# Patient Record
Sex: Female | Born: 1945 | Race: White | Hispanic: No | Smoking: Never smoker
Health system: Southern US, Community
[De-identification: ages and names within clinical notes are randomized; demographics above are authoritative.]

## PROBLEM LIST (undated history)

## (undated) DIAGNOSIS — F32A Depression, unspecified: Secondary | ICD-10-CM

## (undated) DIAGNOSIS — I10 Essential (primary) hypertension: Secondary | ICD-10-CM

## (undated) HISTORY — PX: CHOLECYSTECTOMY: SHX55

## (undated) HISTORY — PX: TUBAL LIGATION: SHX77

## (undated) HISTORY — PX: ABDOMINAL HYSTERECTOMY: SHX81

---

## 2020-04-14 ENCOUNTER — Other Ambulatory Visit: Payer: Self-pay | Admitting: Nephrology

## 2020-04-14 DIAGNOSIS — N1832 Chronic kidney disease, stage 3b: Secondary | ICD-10-CM

## 2020-04-14 DIAGNOSIS — I129 Hypertensive chronic kidney disease with stage 1 through stage 4 chronic kidney disease, or unspecified chronic kidney disease: Secondary | ICD-10-CM

## 2020-04-22 ENCOUNTER — Other Ambulatory Visit: Payer: Self-pay

## 2020-04-22 ENCOUNTER — Ambulatory Visit
Admission: RE | Admit: 2020-04-22 | Discharge: 2020-04-22 | Disposition: A | Discharge status: Home / Self Care | Payer: Medicare Other | Source: Ambulatory Visit | Attending: Nephrology | Admitting: Nephrology

## 2020-04-22 DIAGNOSIS — I129 Hypertensive chronic kidney disease with stage 1 through stage 4 chronic kidney disease, or unspecified chronic kidney disease: Secondary | ICD-10-CM | POA: Diagnosis present

## 2020-04-22 DIAGNOSIS — E1121 Type 2 diabetes mellitus with diabetic nephropathy: Secondary | ICD-10-CM | POA: Insufficient documentation

## 2020-04-22 DIAGNOSIS — N1832 Chronic kidney disease, stage 3b: Secondary | ICD-10-CM | POA: Insufficient documentation

## 2020-08-31 ENCOUNTER — Other Ambulatory Visit: Payer: Self-pay

## 2020-08-31 ENCOUNTER — Ambulatory Visit
Admission: EM | Admit: 2020-08-31 | Discharge: 2020-08-31 | Disposition: A | Discharge status: Home / Self Care | Payer: Medicare Other | Attending: Internal Medicine | Admitting: Internal Medicine

## 2020-08-31 DIAGNOSIS — N200 Calculus of kidney: Secondary | ICD-10-CM | POA: Diagnosis present

## 2020-08-31 HISTORY — DX: Essential (primary) hypertension: I10

## 2020-08-31 HISTORY — DX: Depression, unspecified: F32.A

## 2020-08-31 LAB — URINALYSIS, COMPLETE (UACMP) WITH MICROSCOPIC
Glucose, UA: 100 mg/dL — AB
Leukocytes,Ua: NEGATIVE
Nitrite: NEGATIVE
Protein, ur: 100 mg/dL — AB
RBC / HPF: >50 RBC/hpf (ref 0–5)
Specific Gravity, Urine: >1.03 — ABNORMAL HIGH (ref 1.005–1.030)
pH: 5 (ref 5.0–8.0)

## 2020-08-31 MED ORDER — HYDROCODONE-ACETAMINOPHEN 5-325 MG PO TABS: 1.0000 | ORAL_TABLET | Freq: Four times a day (QID) | ORAL | 0 refills | Status: DC | PRN

## 2020-08-31 MED ORDER — ONDANSETRON 4 MG PO TBDP: 4.0000 mg | ORAL_TABLET | Freq: Three times a day (TID) | ORAL | 0 refills | Status: DC | PRN

## 2020-08-31 MED ORDER — TAMSULOSIN HCL 0.4 MG PO CAPS: 0.4000 mg | ORAL_CAPSULE | Freq: Every day | ORAL | 0 refills | Status: DC

## 2020-08-31 MED ORDER — TAMSULOSIN HCL 0.4 MG PO CAPS: 0.4000 mg | ORAL_CAPSULE | Freq: Every day | ORAL | 0 refills | Status: AC

## 2020-08-31 NOTE — ED Provider Notes (Signed)
[facility]    CSN: [id number] Arrival date & time: 08/31/20  1408      History   Chief Complaint Chief Complaint  Patient presents with  . Hematuria  . Back Pain    HPI Patty Harris is a 74 y.o. female comes to urgent care with complaints of left flank pain which radiates into the left groin area which started last night.  Patient said that the pain is intermittent in nature.  Onset is almost sudden and severe.  No known aggravating or relieving factors.  Currently pain is moderate more of a dull sensation than anything else.  It is associated with blood in urine.  She denies any dysuria urgency or frequency.  She denies any fever or chills.  She has nausea without vomiting.  She has a remote history of kidney stones.Marland Kitchen   HPI  Past Medical History:  Diagnosis Date  . Depression   . Hypertension     There are no problems to display for this patient.   Past Surgical History:  Procedure Laterality Date  . ABDOMINAL HYSTERECTOMY    . CESAREAN SECTION    . CHOLECYSTECTOMY    . TUBAL LIGATION      OB History   No obstetric history on file.      Home Medications    Prior to Admission medications   Medication Sig Start Date End Date Taking? Authorizing Provider  Cholecalciferol 10 MCG (400 UNIT) CAPS Take 1 capsule by mouth daily.   Yes [provider]  cloNIDine (CATAPRES) 0.2 MG tablet Take 0.2 mg by mouth at bedtime. 04/09/20  Yes [provider]  levocetirizine (XYZAL) 5 MG tablet Take 5 mg by mouth every evening.   Yes [provider]  losartan (COZAAR) 100 MG tablet Take 100 mg by mouth daily. 04/09/20  Yes [provider]  rosuvastatin (CRESTOR) 20 MG tablet Take 20 mg by mouth at bedtime. 07/15/20  Yes [provider]  sertraline (ZOLOFT) 100 MG tablet Take 100 mg by mouth daily. 08/25/20  Yes [provider]  zolpidem (AMBIEN) 5 MG tablet Take 5 mg by mouth at bedtime as needed. 07/13/20  Yes [provider]  HYDROcodone-acetaminophen (NORCO/VICODIN) 5-325 MG tablet Take 1 tablet by mouth every 6 (six) hours as needed for moderate pain or severe pain. 08/31/20   Rebeca Valdivia, Myrene Galas, MD  ondansetron (ZOFRAN ODT) 4 MG disintegrating tablet Take 1 tablet (4 mg total) by mouth every 8 (eight) hours as needed for nausea or vomiting. 08/31/20   Keyairra Kolinski, Myrene Galas, MD  tamsulosin (FLOMAX) 0.4 MG CAPS capsule Take 1 capsule (0.4 mg total) by mouth daily for 14 days. 08/31/20 09/14/20  Chase Picket, MD    Family History History reviewed. No pertinent family history.  Social History Social History   Tobacco Use  . Smoking status: Never Smoker  . Smokeless tobacco: Never Used  Vaping Use  . Vaping Use: Never used  Substance Use Topics  . Alcohol use: Never  . Drug use: Never     Allergies   Patient has no known allergies.   Review of Systems Review of Systems  Constitutional: Negative.   Respiratory: Negative.   Genitourinary: Positive for dysuria, flank pain and hematuria. Negative for frequency, vaginal discharge and vaginal pain.     Physical Exam Triage Vital Signs ED Triage Vitals  Enc Vitals Group     BP 08/31/20 1436 (!) 153/91     Pulse Rate 08/31/20 1436  82     Resp 08/31/20 1436 18     Temp 08/31/20 1436 99.3 F (37.4 C)     Temp Source 08/31/20 1436 Oral     SpO2 08/31/20 1436 97 %     Weight 08/31/20 1432 180 lb (81.6 kg)     Height 08/31/20 1432 5\' 2"  (1.575 m)     Head Circumference --      Peak Flow --      Pain Score 08/31/20 1431 6     Pain Loc --      Pain Edu? --      Excl. in [place]? --    No data found.  Updated Vital Signs BP (!) 153/91 (BP Location: Right Arm)   Pulse 82   Temp 99.3 F (37.4 C) (Oral)   Resp 18   Ht 5\' 2"  (1.575 m)   Wt 81.6 kg   SpO2 97%   BMI 32.92 kg/m   Visual Acuity Right Eye Distance:   Left Eye Distance:   Bilateral Distance:    Right Eye Near:   Left Eye Near:    Bilateral Near:     Physical  Exam Vitals and nursing note reviewed.  Constitutional:      General: She is not in acute distress.    Appearance: She is not ill-appearing.  Cardiovascular:     Rate and Rhythm: Normal rate and regular rhythm.  Abdominal:     General: Bowel sounds are normal.     Palpations: Abdomen is soft.     Tenderness: There is left CVA tenderness.  Musculoskeletal:        General: Normal range of motion.  Skin:    General: Skin is warm.     Capillary Refill: Capillary refill takes less than 2 seconds.  Neurological:     Mental Status: She is alert.      UC Treatments / Results  Labs (all labs ordered are listed, but only abnormal results are displayed) Labs Reviewed  URINALYSIS, COMPLETE (UACMP) WITH MICROSCOPIC - Abnormal; Notable for the following components:      Result Value   APPearance HAZY (*)    Specific Gravity, Urine >1.030 (*)    Glucose, UA 100 (*)    Hgb urine dipstick LARGE (*)    Bilirubin Urine MODERATE (*)    Ketones, ur TRACE (*)    Protein, ur 100 (*)    Bacteria, UA FEW (*)    All other components within normal limits  URINE CULTURE    EKG   Radiology No results found.  Procedures Procedures (including critical care time)  Medications Ordered in UC Medications - No data to display  Initial Impression / Assessment and Plan / UC Course  I have reviewed the triage vital signs and the nursing notes.  Pertinent labs & imaging results that were available during my care of the patient were reviewed by me and considered in my medical decision making (see chart for details).     Nephrolithiasis: Urine is significant for hematuria, greater than 50 WBC per high-power field, WBC is 0-5. Urine culture sent Tamsulosin 0.4 mg daily for the next 10 to 14 days Zofran ODT as needed for nausea/vomiting Hydrocodone-acetaminophen 1 tablet every 6 hours as needed for pain Patient is advised to increase oral fluid intake If patient experiences worsening pain,  persistent nausea/vomiting, fever, chills, persistent hematuria she is advised to return to urgent care to be reevaluated. Final Clinical Impressions(s) / UC Diagnoses  Final diagnoses:  Nephrolithiasis     Discharge Instructions     Increase oral fluid intake Take medications as prescribed If symptoms worsen, please return to the urgent care to be reevaluated If you develop any fever, chills, nausea, vomiting please return to the urgent care to be evaluated immediately.   ED Prescriptions    Medication Sig Dispense Auth. Provider   tamsulosin (FLOMAX) 0.4 MG CAPS capsule  (Status: Discontinued) Take 1 capsule (0.4 mg total) by mouth daily for 14 days. 14 capsule Maliki Gignac, Myrene Galas, MD   HYDROcodone-acetaminophen (NORCO/VICODIN) 5-325 MG tablet  (Status: Discontinued) Take 1 tablet by mouth every 6 (six) hours as needed for moderate pain or severe pain. 6 tablet Delany Steury, Myrene Galas, MD   ondansetron (ZOFRAN ODT) 4 MG disintegrating tablet  (Status: Discontinued) Take 1 tablet (4 mg total) by mouth every 8 (eight) hours as needed for nausea or vomiting. 20 tablet Audie Wieser, Myrene Galas, MD   HYDROcodone-acetaminophen (NORCO/VICODIN) 5-325 MG tablet Take 1 tablet by mouth every 6 (six) hours as needed for moderate pain or severe pain. 6 tablet Kaylynn Chamblin, Myrene Galas, MD   ondansetron (ZOFRAN ODT) 4 MG disintegrating tablet Take 1 tablet (4 mg total) by mouth every 8 (eight) hours as needed for nausea or vomiting. 20 tablet Charlesia Canaday, Myrene Galas, MD   tamsulosin (FLOMAX) 0.4 MG CAPS capsule Take 1 capsule (0.4 mg total) by mouth daily for 14 days. 14 capsule Analeese Andreatta, Myrene Galas, MD     I have reviewed the PDMP during this encounter.   Chase Picket, MD 08/31/20 1539

## 2020-08-31 NOTE — ED Triage Notes (Signed)
Patient states that she has been having low back pain that has been consistent with hematuria. Patient states that she has left flank pain and bloating.

## 2020-08-31 NOTE — Discharge Instructions (Signed)
Increase oral fluid intake Take medications as prescribed If symptoms worsen, please return to the urgent care to be reevaluated If you develop any fever, chills, nausea, vomiting please return to the urgent care to be evaluated immediately.

## 2020-09-02 LAB — URINE CULTURE

## 2020-09-19 NOTE — Progress Notes (Signed)
09/22/2020 1:42 PM   Patty Harris Dec 12, 1946 [id number]  Referring provider: Leonel Ramsay, MD [place],  [place] [address] Chief Complaint  Patient presents with  . Nephrolithiasis    HPI: Patty Harris is a 74 y.o. female who presents today for evaluation and management of nephrolithiasis.   Renal US from 04/22/20 revealed bilateral renal cortical thinning consistent with atrophy. Bilateral increased renal echogenicity consistent chronic medical renal disease. No acute abnormality. No evidence of hydronephrosis or bladder distention.  He was seen at the Urgent Care in [facility] on 08/31/20 for hematuria and back pain. He had left flank pain that radiated to the left groin. Pain was severe and intermittent. No dysuria, fevers or chills. UA noted larger blood, >50 RBC, few bacteria. Urine culture showed multiple species present. She was placed on tamsulosin and Zofran prn.   She reports having a kidney stone 3-4 year ago and denies having surgery. The patient reports seeing the stone pass. She took hydrocodone and ibuprofen for pain. She feels like she can improve her diet.   She reports sending her stones for analysis. States 90 % uric acid and 10% calcium. Denies any gout flares.   She has a remote history of kidney stones.   Her husband recently passed away. In 2 weeks she will move from a retirement community to apartment.   PMH: Past Medical History:  Diagnosis Date  . Depression   . Hypertension     Surgical History: Past Surgical History:  Procedure Laterality Date  . ABDOMINAL HYSTERECTOMY    . CESAREAN SECTION    . CHOLECYSTECTOMY    . TUBAL LIGATION      Home Medications:  Allergies as of 09/22/2020      Reactions   Tramadol Hives, Itching      Medication List       Accurate as of September 22, 2020  1:42 PM. If you have any questions, ask your nurse or doctor.        STOP taking these medications   HYDROcodone-acetaminophen 5-325  MG tablet Commonly known as: NORCO/VICODIN Stopped by: Hollice Espy, MD   ondansetron 4 MG disintegrating tablet Commonly known as: Zofran ODT Stopped by: Hollice Espy, MD     TAKE these medications   Accu-Chek Aviva Plus test strip Generic drug: glucose blood 1 each daily.   Accu-Chek FastClix Lancets Misc Apply topically.   amLODipine 5 MG tablet Commonly known as: NORVASC Take 5 mg by mouth daily.   Cholecalciferol 10 MCG (400 UNIT) Caps Take 1 capsule by mouth daily.   cloNIDine 0.2 MG tablet Commonly known as: CATAPRES Take 0.2 mg by mouth at bedtime.   diazepam 5 MG tablet Commonly known as: VALIUM SMARTSIG:1 Tablet(s) By Mouth Every 12 Hours PRN   ergocalciferol 1.25 MG (50000 UT) capsule Commonly known as: VITAMIN D2 Take by mouth.   levocetirizine 5 MG tablet Commonly known as: XYZAL Take 5 mg by mouth every evening.   losartan 100 MG tablet Commonly known as: COZAAR Take 100 mg by mouth daily.   rosuvastatin 20 MG tablet Commonly known as: CRESTOR Take 20 mg by mouth at bedtime.   sertraline 100 MG tablet Commonly known as: ZOLOFT Take 100 mg by mouth daily.   zolpidem 5 MG tablet Commonly known as: AMBIEN Take 5 mg by mouth at bedtime as needed (patient takes 2.5 mg at bed time).       Allergies:  Allergies  Allergen Reactions  . Tramadol  Hives and Itching    Family History: Family History  Problem Relation Age of Onset  . Bladder Cancer Father     Social History:  reports that she has never smoked. She has never used smokeless tobacco. She reports that she does not drink alcohol and does not use drugs.   Physical Exam: BP (!) 148/89   Pulse 79   Ht 5\' 2"  (1.575 m)   Wt 166 lb (75.3 kg)   BMI 30.36 kg/m   Constitutional:  Alert and oriented, No acute distress. HEENT: [place] AT, moist mucus membranes.  Trachea midline, no masses. Cardiovascular: No clubbing, cyanosis, or edema. Respiratory: Normal respiratory effort, no  increased work of breathing. Skin: No rashes, bruises or suspicious lesions. Neurologic: Grossly intact, no focal deficits, moving all 4 extremities. Psychiatric: Normal mood and affect.  Laboratory Data:  Urinalysis Negative  Pertinent Imaging:  Results for orders placed during the hospital encounter of 04/22/20  US RENAL  Narrative CLINICAL DATA:  Chronic renal disease.  EXAM: RENAL / URINARY TRACT ULTRASOUND COMPLETE  COMPARISON:  No prior.  FINDINGS: Right Kidney:  Renal measurements: 9.1 x 5.0 x 5.1 cm = volume: 122 mL. Renal cortical thinning. Increased echogenicity. No mass or hydronephrosis visualized.  Left Kidney:  Renal measurements: 10.6 x 4.7 x 4.1 cm = volume: 106 mL. Renal cortical thinning. Increased echogenicity. No mass or hydronephrosis visualized.  Bladder:  Appears normal for degree of bladder distention.  Other:  None.  IMPRESSION: 1. Bilateral renal cortical thinning consistent with atrophy. Bilateral increased renal echogenicity consistent chronic medical renal disease.  2. No acute abnormality. No evidence of hydronephrosis or bladder distention.   Electronically Signed By: Marcello Moores  Register On: 04/23/2020 06:18  I have personally reviewed the images and agree with radiologist interpretation.     Assessment & Plan:    1. Nephrolithiasis  We discussed general stone prevention techniques including drinking plenty water with goal of producing 2.5 L urine daily, increased citric acid intake, avoidance of high oxalate containing foods, and decreased salt intake.  Information about dietary recommendations given today.  Stone composition is 90% uric acid in the past, if she does need further imaging, KUB not likely the best approach, would consider either repeat renal ultrasound versus CT scan No further flank pain after visually seeing the stone pass, urine is clear today Given that she had a relatively recent reassuring renal  ultrasound in 03/2020, is unlikely that she has any residual significant sized stone burden thus will hold off on further imaging or intervention at this time Encourage patient to follow up as needed.   2. Microscopic hematuria  Previous UA in 03/2020 showed no microscopic blood. UA negative today. Related to stone event with associated flank pain.    [facility] [address] [place], [place] [address] [phone]  I, Selena Batten, am acting as a scribe for Dr. Hollice Espy.  I have reviewed the above documentation for accuracy and completeness, and I agree with the above.   Hollice Espy, MD

## 2020-09-22 ENCOUNTER — Encounter: Payer: Self-pay | Admitting: Urology

## 2020-09-22 ENCOUNTER — Ambulatory Visit: Payer: Medicare Other | Admitting: Urology

## 2020-09-22 ENCOUNTER — Other Ambulatory Visit: Payer: Self-pay

## 2020-09-22 VITALS — BP 148/89 | HR 79 | Ht 62.0 in | Wt 166.0 lb

## 2020-09-22 DIAGNOSIS — N200 Calculus of kidney: Secondary | ICD-10-CM

## 2020-09-23 LAB — URINALYSIS, COMPLETE
Bilirubin, UA: NEGATIVE
Glucose, UA: NEGATIVE
Ketones, UA: NEGATIVE
Leukocytes,UA: NEGATIVE
Nitrite, UA: NEGATIVE
Protein,UA: NEGATIVE
RBC, UA: NEGATIVE
Specific Gravity, UA: >1.03 — ABNORMAL HIGH (ref 1.005–1.030)
Urobilinogen, Ur: 1 mg/dL (ref 0.2–1.0)
pH, UA: 5 (ref 5.0–7.5)

## 2020-09-23 LAB — MICROSCOPIC EXAMINATION

## 2020-10-01 ENCOUNTER — Other Ambulatory Visit: Payer: Self-pay | Admitting: Infectious Diseases

## 2020-10-01 DIAGNOSIS — Z1231 Encounter for screening mammogram for malignant neoplasm of breast: Secondary | ICD-10-CM

## 2020-10-13 ENCOUNTER — Ambulatory Visit
Admission: RE | Admit: 2020-10-13 | Discharge: 2020-10-13 | Disposition: A | Discharge status: Home / Self Care | Payer: Medicare Other | Source: Ambulatory Visit | Attending: Infectious Diseases | Admitting: Infectious Diseases

## 2020-10-13 ENCOUNTER — Other Ambulatory Visit: Payer: Self-pay

## 2020-10-13 DIAGNOSIS — Z1231 Encounter for screening mammogram for malignant neoplasm of breast: Secondary | ICD-10-CM | POA: Diagnosis not present

## 2020-11-30 ENCOUNTER — Other Ambulatory Visit: Payer: Self-pay | Admitting: Infectious Diseases

## 2020-11-30 DIAGNOSIS — D329 Benign neoplasm of meninges, unspecified: Secondary | ICD-10-CM

## 2020-12-08 ENCOUNTER — Ambulatory Visit
Admission: RE | Admit: 2020-12-08 | Discharge: 2020-12-08 | Disposition: A | Discharge status: Home / Self Care | Payer: Medicare Other | Source: Ambulatory Visit | Attending: Infectious Diseases | Admitting: Infectious Diseases

## 2020-12-08 ENCOUNTER — Other Ambulatory Visit: Payer: Self-pay

## 2020-12-08 DIAGNOSIS — D329 Benign neoplasm of meninges, unspecified: Secondary | ICD-10-CM | POA: Insufficient documentation

## 2021-06-29 ENCOUNTER — Other Ambulatory Visit: Payer: Self-pay | Admitting: Family Medicine

## 2021-06-29 DIAGNOSIS — R1013 Epigastric pain: Secondary | ICD-10-CM

## 2021-06-29 DIAGNOSIS — R140 Abdominal distension (gaseous): Secondary | ICD-10-CM

## 2021-06-29 DIAGNOSIS — R143 Flatulence: Secondary | ICD-10-CM

## 2021-07-13 ENCOUNTER — Other Ambulatory Visit: Payer: Self-pay

## 2021-07-13 ENCOUNTER — Ambulatory Visit
Admission: RE | Admit: 2021-07-13 | Discharge: 2021-07-13 | Disposition: A | Discharge status: Home / Self Care | Payer: Medicare Other | Source: Ambulatory Visit | Attending: Family Medicine | Admitting: Family Medicine

## 2021-07-13 DIAGNOSIS — R1013 Epigastric pain: Secondary | ICD-10-CM

## 2021-07-13 DIAGNOSIS — R143 Flatulence: Secondary | ICD-10-CM | POA: Insufficient documentation

## 2021-07-13 DIAGNOSIS — R140 Abdominal distension (gaseous): Secondary | ICD-10-CM | POA: Insufficient documentation

## 2021-11-17 ENCOUNTER — Other Ambulatory Visit: Payer: Self-pay | Admitting: Infectious Diseases

## 2021-11-17 DIAGNOSIS — Z1231 Encounter for screening mammogram for malignant neoplasm of breast: Secondary | ICD-10-CM

## 2021-12-02 ENCOUNTER — Encounter: Payer: Self-pay | Admitting: Dietician

## 2021-12-02 ENCOUNTER — Other Ambulatory Visit: Payer: Self-pay

## 2021-12-02 ENCOUNTER — Encounter: Payer: Medicare Other | Attending: Infectious Diseases | Admitting: Dietician

## 2021-12-02 VITALS — Ht 62.0 in | Wt 173.8 lb

## 2021-12-02 DIAGNOSIS — N1832 Chronic kidney disease, stage 3b: Secondary | ICD-10-CM

## 2021-12-02 DIAGNOSIS — Z713 Dietary counseling and surveillance: Secondary | ICD-10-CM | POA: Diagnosis not present

## 2021-12-02 DIAGNOSIS — E119 Type 2 diabetes mellitus without complications: Secondary | ICD-10-CM | POA: Diagnosis present

## 2021-12-02 DIAGNOSIS — I10 Essential (primary) hypertension: Secondary | ICD-10-CM

## 2021-12-02 DIAGNOSIS — E1122 Type 2 diabetes mellitus with diabetic chronic kidney disease: Secondary | ICD-10-CM

## 2021-12-02 NOTE — Patient Instructions (Signed)
Great job making healthy food choices!  Include a small serving of carbohydrate with each meal or snack for energy and fiber, and to help manage protein portions.  Work on regular exercise to help manage stress and improve liver health and blood sugars.

## 2021-12-02 NOTE — Progress Notes (Signed)
Medical Nutrition Therapy: Visit start time: 1500  end time: 1600  Assessment:  Diagnosis: Type 2 DM, HTN Past medical history: CKD Stage 3a Psychosocial issues/ stress concerns: history of depression, anxiety  Preferred learning method:  Auditory -- discussion Visual  Current weight: 173.8lbs with shoes, sweater Height: 5'2" BMI: 31.79 Medications, supplements: reconciled list in medical record  Progress and evaluation:  Patient reports weight increased after moving to [place] and then death of husband 16 months ago. She has moved 2 more times since husband's death, and is now planning to move to another county.  She reports highest weight of about 180lbs; she was selling real estate prior to 2020 and was eating frequent fast food meals. She has made changes to strictly limit carbohydrate foods and fats in effort to lose weight and reduce fat in liver which has caused bloating. She does not like to cook, so typically eats simple, quick meals; will get salads from restaurants and eat for 2 meals.  She does not like cooked vegetables, red meats, eggs, cheese, fish other than salmon and shrimp. Recent labwork results: (11/15/21) BG 152, BUN 27, creatinine 1.7, GFR 30, sodium 143, potassium 4.4; (07/27/21) BG 146, BUN 31, creatinine 1.6, GFR 32, HbA1C 6.7%  Physical activity: walking 20 minutes, 3-4 times a week  Dietary Intake:  Usual eating pattern includes 3 meals and 0-1 snacks per day. Dining out frequency: 5-6 meals per week.  Breakfast: 10-11am -- fruit and peanut butter; occsionally 1/2 biscuit with egg + sometimes meat Snack: none Lunch: 1-2pm -- lite meal ie 1/2 salad/ chicken salad/chick fila salad/ cobb salad Snack: Rx Bar Supper: 5-5:30pm -- grilled chicken with balsamic dressing no veg or starch Snack: none Beverages: water 12oz 4-5x daily, occasionally diet soda or tea; hot tea in cold weather no artificial sweeteners  Nutrition Care Education: Topics covered:  Basic  nutrition: basic food groups, appropriate nutrient balance, appropriate meal and snack schedule, general nutrition guidelines    Weight control: importance of low sugar and low fat choices; appropriate food portions, estimated energy needs for weight loss at 1300 kcal, provided guidance for 45% CHO, 25% pro, 30% fat; discussed role of physical activity on weight, stress, fatty liver, blood sugars, and blood pressure Diabetes:  goals for BGs, appropriate carb intake and balance, healthy carb choices Hypertension:  importance of controlling BP, identifying high sodium foods, identifying food sources of potassium, magnesium CKD: limiting sodium and controlling portions of protein foods, advised  <1g/ kg BW or 60-70g protein daily Fatty liver: avoiding added sugars and processed starches, controlling portions of carb foods, Mediterranean eating pattern  Nutritional Diagnosis:  [place]-2.1 Inpaired nutrition utilization and [place]-2.2 Altered nutrition-related laboratory As related to Type 2 Diabetes, fatty liver, CKD, HTN.  As evidenced by patient with elevated HbA1C, low GFR. [place]-3.3 Overweight/obesity As related to history of excess calories, inadequate physical activity, and stress.  As evidenced by patient with current BMI of 31.79, making diet and lifestyle changes to reduce stress, lose weight, and improve health.  Intervention:  Instruction and discussion as noted above. Patient has been making significant lifestyle changes, and is motivated to continue.  Encouraged adding small portions of whole grains, peas and/or beans to meals in order to meet basic nutritional needs. Encouraged gradually increasing to goal of at least 30g carb with meals. Advised ongoing increase in physical activity. No follow up scheduled at this time; patient to schedule later as needed. Follow up would be virtual due to patient's pending move.  Education Materials given:  Conservation officer, historic buildings for [employer] with Kidney Disease  ([place]) Plate Planner with food lists, sample meal pattern Get Healthy with Mediterranean Style Eating Visit summary with goals/ instructions   Learner/ who was taught:  Patient   Level of understanding: Verbalizes/ demonstrates competency   Demonstrated degree of understanding via:   Teach back Learning barriers: None  Willingness to learn/ readiness for change: Eager, change in progress   Monitoring and Evaluation:  Dietary intake, exercise, BG control, liver health, renal function, BP control, and body weight      follow up: prn

## 2023-07-07 IMAGING — US US ABDOMEN COMPLETE
1 series · 14 of 25 positions shown · non-contrast
Comparison: Ultrasound kidneys 04/22/2020

CLINICAL DATA: Abdominal bloating and epigastric pain for 2 months

EXAM:
ABDOMEN ULTRASOUND COMPLETE

[Series 1: us abdomen complete · 0.23mm/px · 14 of 91 slices shown]
[im 1/91]
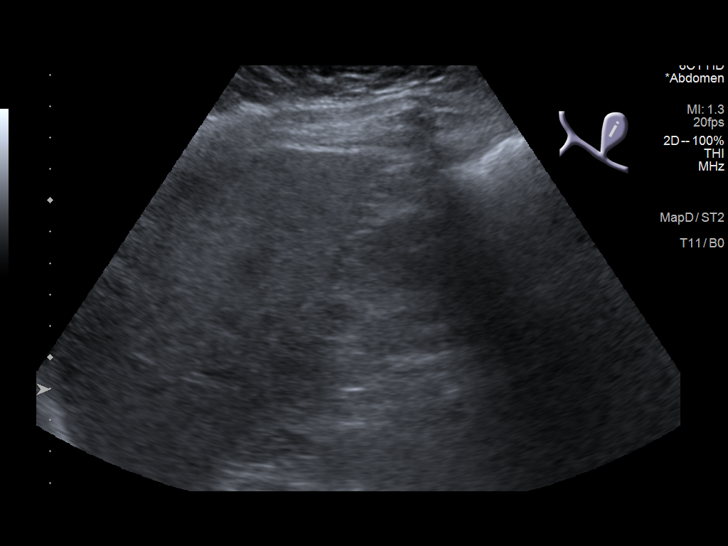
[im 8/91]
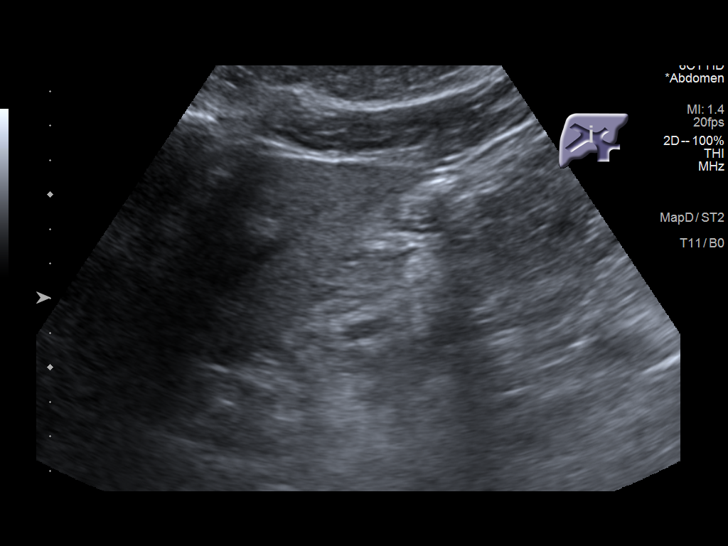
[im 16/91]
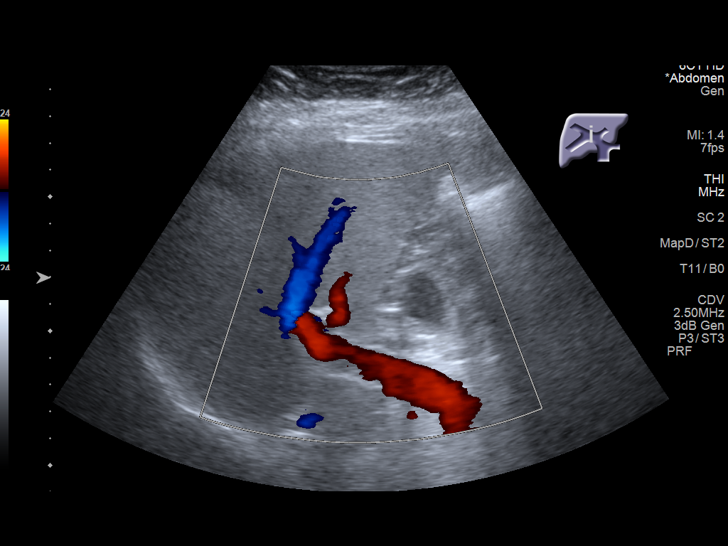
[im 23/91]
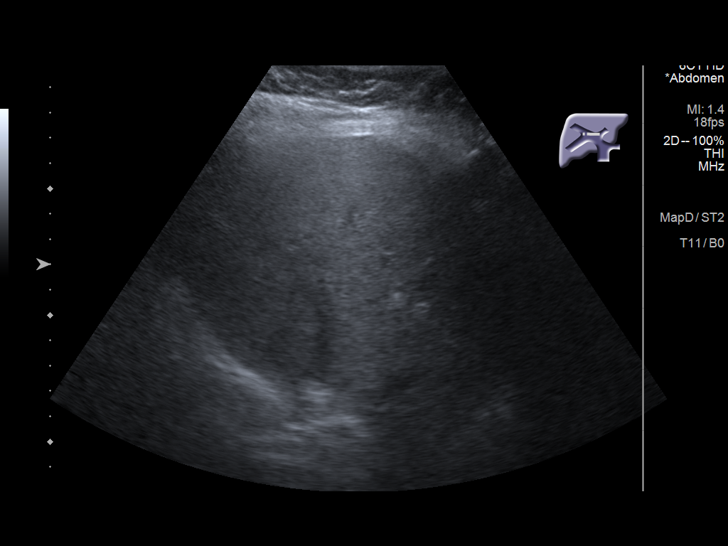
[im 31/91]
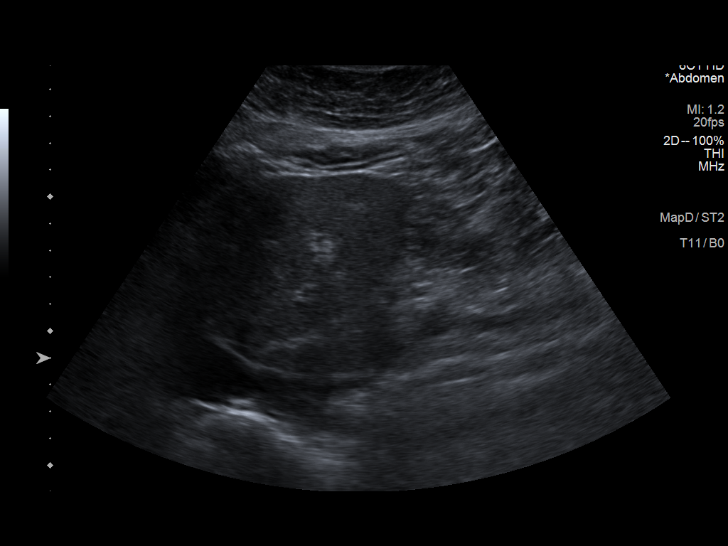
[im 34/91]
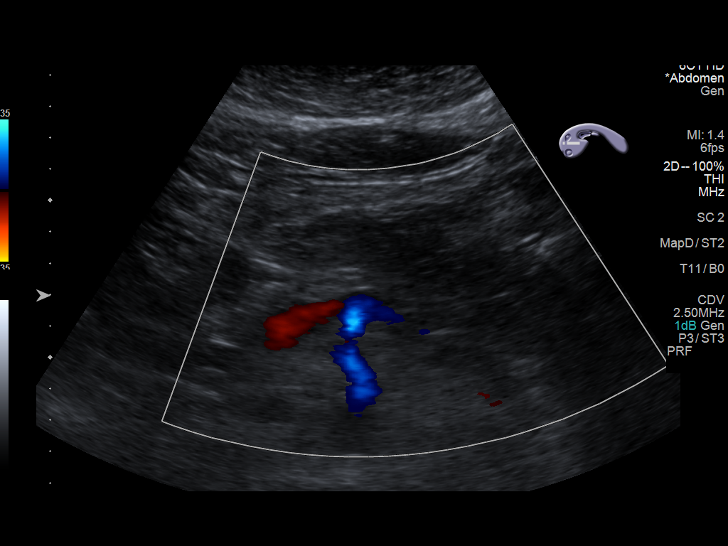
[im 42/91]
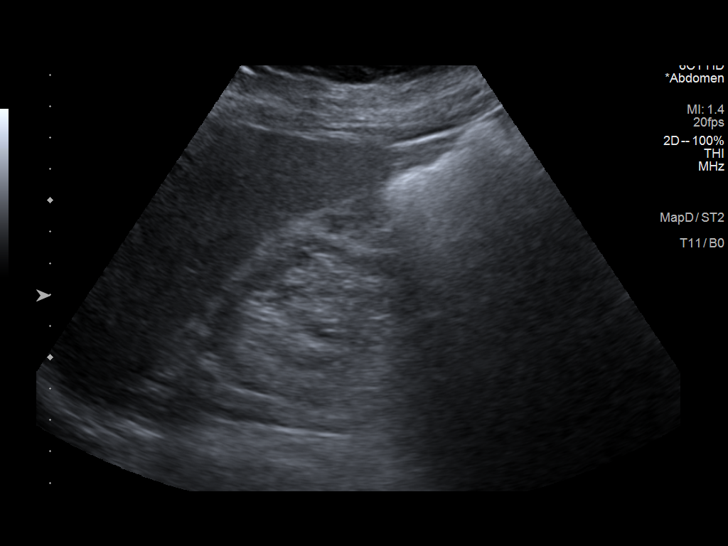
[im 49/91]
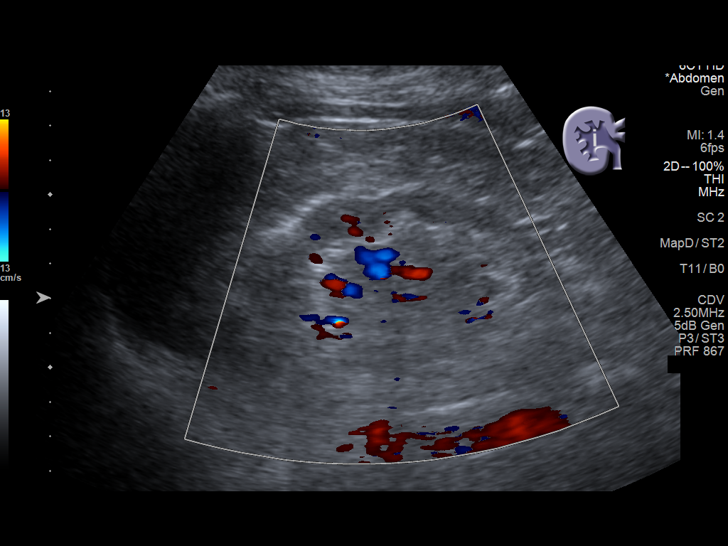
[im 57/91]
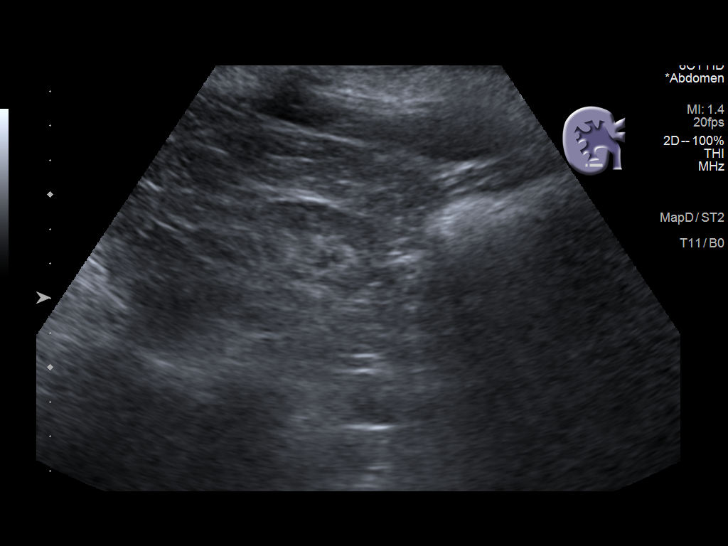
[im 61/91]
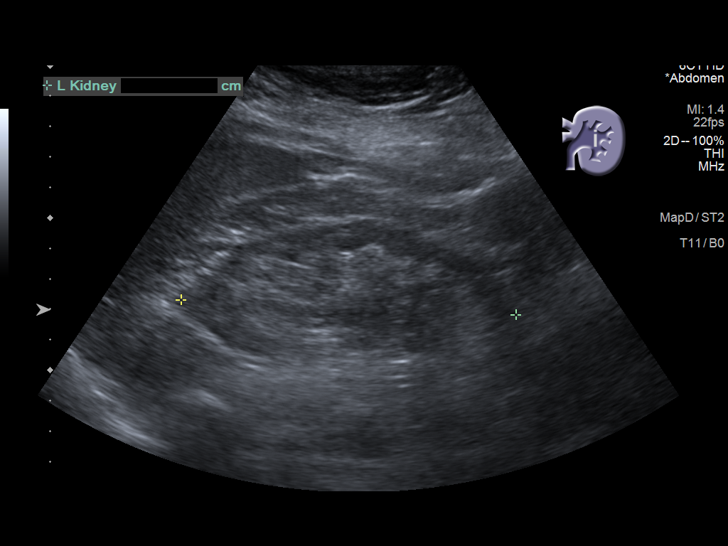
[im 68/91]
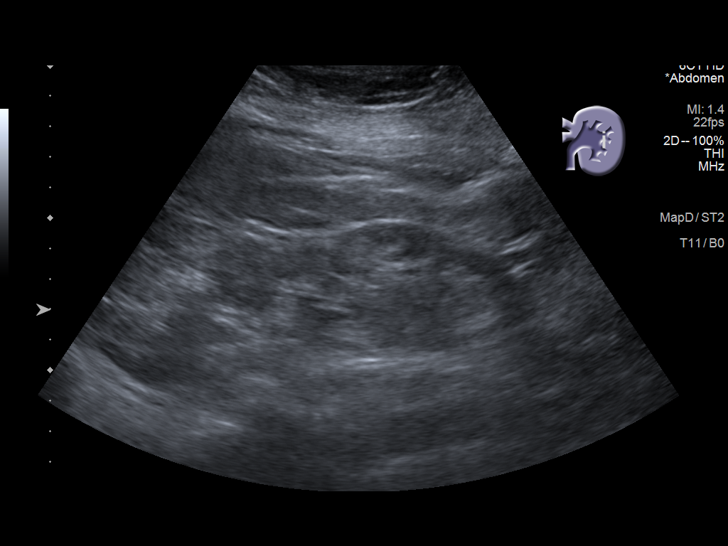
[im 76/91]
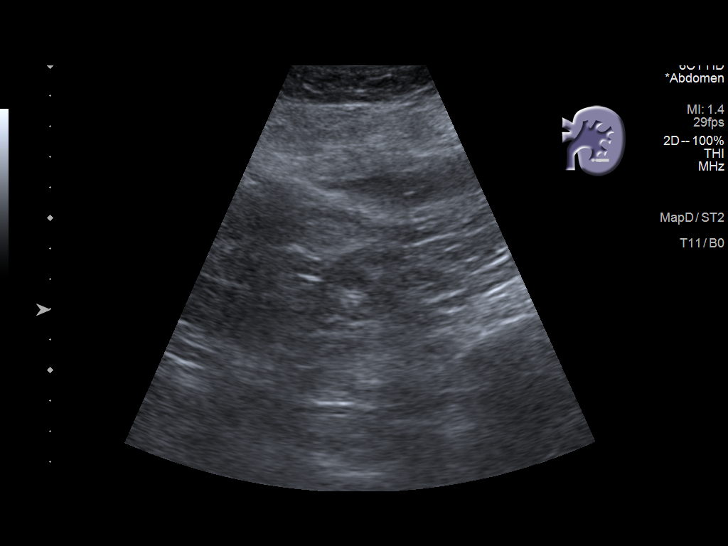
[im 83/91]
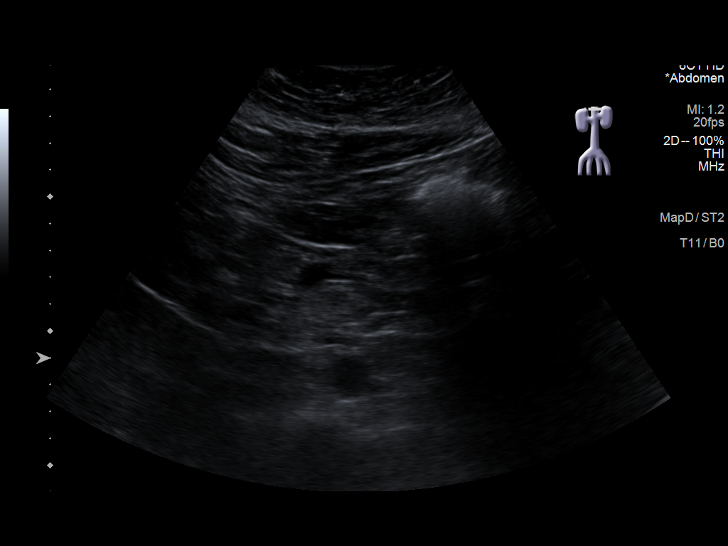
[im 91/91]
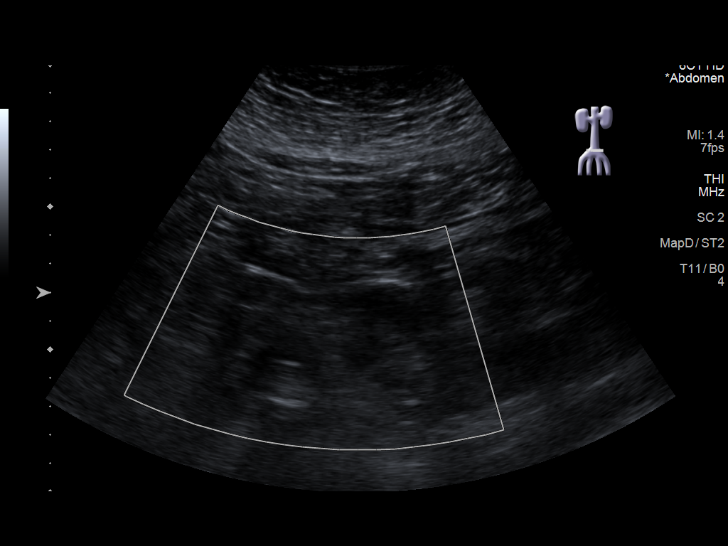

[14 of 25 positions shown; findings below may reference images not displayed]

FINDINGS: Gallbladder: Surgically absent

Common bile duct: Diameter: 6 mm

Liver:

No focal lesion.

Diffusely increased parenchymal echogenicity.

Portal vein is patent on color Doppler imaging with normal direction
of blood flow towards the liver.

IVC: No abnormality visualized.

Pancreas: Visualized portion unremarkable.

Spleen: Size and appearance within normal limits.

Right Kidney: Length: 9.1 cm. Diffusely thinned echogenic cortex. No
hydronephrosis.

Left Kidney: Length: 11.0 cm. Diffusely thinned echogenic cortex. No
hydronephrosis.

Abdominal aorta: No aneurysm visualized.

Other findings: None.
IMPRESSION: 1. Diffuse increased echogenicity of the hepatic parenchyma is a
nonspecific indicator of hepatocellular dysfunction, most commonly
steatosis.
2. Diffusely thinned echogenic renal cortices indicative of chronic
medical renal disease.
